# Patient Record
Sex: Female | Born: 1945 | Race: White | Hispanic: No | Marital: Married | Smoking: Never smoker
Health system: Southern US, Community
[De-identification: ages and names within clinical notes are randomized; demographics above are authoritative.]

## PROBLEM LIST (undated history)

## (undated) DIAGNOSIS — M1990 Unspecified osteoarthritis, unspecified site: Secondary | ICD-10-CM

## (undated) DIAGNOSIS — H269 Unspecified cataract: Secondary | ICD-10-CM

## (undated) DIAGNOSIS — N189 Chronic kidney disease, unspecified: Secondary | ICD-10-CM

## (undated) DIAGNOSIS — M810 Age-related osteoporosis without current pathological fracture: Secondary | ICD-10-CM

## (undated) DIAGNOSIS — R110 Nausea: Secondary | ICD-10-CM

## (undated) DIAGNOSIS — C50919 Malignant neoplasm of unspecified site of unspecified female breast: Secondary | ICD-10-CM

## (undated) DIAGNOSIS — E785 Hyperlipidemia, unspecified: Secondary | ICD-10-CM

## (undated) DIAGNOSIS — I10 Essential (primary) hypertension: Secondary | ICD-10-CM

## (undated) DIAGNOSIS — K219 Gastro-esophageal reflux disease without esophagitis: Secondary | ICD-10-CM

## (undated) DIAGNOSIS — K589 Irritable bowel syndrome without diarrhea: Secondary | ICD-10-CM

## (undated) DIAGNOSIS — T7840XA Allergy, unspecified, initial encounter: Secondary | ICD-10-CM

## (undated) DIAGNOSIS — F419 Anxiety disorder, unspecified: Secondary | ICD-10-CM

## (undated) DIAGNOSIS — G4700 Insomnia, unspecified: Secondary | ICD-10-CM

## (undated) HISTORY — DX: Essential (primary) hypertension: I10

## (undated) HISTORY — PX: POLYPECTOMY: SHX149

## (undated) HISTORY — PX: CHOLECYSTECTOMY: SHX55

## (undated) HISTORY — PX: TOTAL ABDOMINAL HYSTERECTOMY: SHX209

## (undated) HISTORY — DX: Chronic kidney disease, unspecified: N18.9

## (undated) HISTORY — PX: COSMETIC SURGERY: SHX468

## (undated) HISTORY — PX: TONSILLECTOMY: SUR1361

## (undated) HISTORY — DX: Allergy, unspecified, initial encounter: T78.40XA

## (undated) HISTORY — DX: Nausea: R11.0

## (undated) HISTORY — DX: Anxiety disorder, unspecified: F41.9

## (undated) HISTORY — DX: Unspecified cataract: H26.9

## (undated) HISTORY — PX: COLONOSCOPY: SHX174

## (undated) HISTORY — DX: Gastro-esophageal reflux disease without esophagitis: K21.9

## (undated) HISTORY — DX: Unspecified osteoarthritis, unspecified site: M19.90

## (undated) HISTORY — DX: Insomnia, unspecified: G47.00

## (undated) HISTORY — PX: MASTECTOMY: SHX3

## (undated) HISTORY — DX: Irritable bowel syndrome, unspecified: K58.9

## (undated) HISTORY — DX: Hyperlipidemia, unspecified: E78.5

## (undated) HISTORY — DX: Malignant neoplasm of unspecified site of unspecified female breast: C50.919

## (undated) HISTORY — DX: Age-related osteoporosis without current pathological fracture: M81.0

---

## 2005-06-04 ENCOUNTER — Ambulatory Visit (HOSPITAL_COMMUNITY): Admission: RE | Admit: 2005-06-04 | Discharge: 2005-06-04 | Payer: Self-pay | Admitting: Chiropractic Medicine

## 2005-08-15 ENCOUNTER — Ambulatory Visit: Payer: Self-pay | Admitting: Internal Medicine

## 2005-09-24 ENCOUNTER — Encounter (INDEPENDENT_AMBULATORY_CARE_PROVIDER_SITE_OTHER): Payer: Self-pay | Admitting: *Deleted

## 2005-09-24 ENCOUNTER — Ambulatory Visit: Payer: Self-pay | Admitting: Internal Medicine

## 2008-10-26 ENCOUNTER — Ambulatory Visit: Payer: Self-pay | Admitting: Internal Medicine

## 2008-11-10 ENCOUNTER — Ambulatory Visit: Payer: Self-pay | Admitting: Internal Medicine

## 2011-10-20 ENCOUNTER — Encounter: Payer: Self-pay | Admitting: Internal Medicine

## 2012-01-04 ENCOUNTER — Encounter: Payer: Self-pay | Admitting: Internal Medicine

## 2012-01-17 ENCOUNTER — Ambulatory Visit: Payer: Self-pay | Admitting: Obstetrics and Gynecology

## 2012-03-06 ENCOUNTER — Encounter: Payer: Self-pay | Admitting: Internal Medicine

## 2012-06-02 ENCOUNTER — Encounter: Payer: Self-pay | Admitting: Internal Medicine

## 2012-07-01 ENCOUNTER — Ambulatory Visit (AMBULATORY_SURGERY_CENTER): Payer: BC Managed Care – PPO

## 2012-07-01 VITALS — Ht 66.0 in | Wt 151.9 lb

## 2012-07-01 DIAGNOSIS — Z1211 Encounter for screening for malignant neoplasm of colon: Secondary | ICD-10-CM

## 2012-07-01 DIAGNOSIS — Z86010 Personal history of colonic polyps: Secondary | ICD-10-CM

## 2012-07-01 DIAGNOSIS — Z800 Family history of malignant neoplasm of digestive organs: Secondary | ICD-10-CM

## 2012-07-01 MED ORDER — MOVIPREP 100 G PO SOLR: ORAL | Status: DC

## 2012-07-02 ENCOUNTER — Encounter: Payer: Self-pay | Admitting: Internal Medicine

## 2012-07-15 ENCOUNTER — Ambulatory Visit (AMBULATORY_SURGERY_CENTER): Payer: Medicare Other | Admitting: Internal Medicine

## 2012-07-15 ENCOUNTER — Encounter: Payer: Self-pay | Admitting: Internal Medicine

## 2012-07-15 VITALS — BP 141/50 | HR 65 | Temp 96.8°F | Resp 18 | Ht 66.0 in | Wt 151.0 lb

## 2012-07-15 DIAGNOSIS — Z86010 Personal history of colonic polyps: Secondary | ICD-10-CM

## 2012-07-15 DIAGNOSIS — Z800 Family history of malignant neoplasm of digestive organs: Secondary | ICD-10-CM

## 2012-07-15 DIAGNOSIS — D126 Benign neoplasm of colon, unspecified: Secondary | ICD-10-CM

## 2012-07-15 DIAGNOSIS — Z1211 Encounter for screening for malignant neoplasm of colon: Secondary | ICD-10-CM

## 2012-07-15 MED ORDER — SODIUM CHLORIDE 0.9 % IV SOLN: 500.0000 mL | INTRAVENOUS | Status: DC

## 2012-07-15 NOTE — Progress Notes (Addendum)
Patient did not have preoperative order for IV antibiotic SSI prophylaxis. (G8918)  Patient did not experience any of the following events: a burn prior to discharge; a fall within the facility; wrong site/side/patient/procedure/implant event; or a hospital transfer or hospital admission upon discharge from the facility. (G8907)  

## 2012-07-15 NOTE — Op Note (Signed)
[facility] [address] [place], [address]   COLONOSCOPY PROCEDURE REPORT  PATIENT: Brittany Fuentes, Brittany Fuentes  MR#: [id number] BIRTHDATE: January 18, 1946 , 65  yrs. old GENDER: Female ENDOSCOPIST: Roxy Cedar, MD REFERRED [id number] Program Recall PROCEDURE DATE:  07/15/2012 PROCEDURE:   Colonoscopy with snare polypectomy    x 1 ASA CLASS:   Class II INDICATIONS:patient's personal history of adenomatous colon polyps (index 2006, f/u 2009) and patient's immediate family history of colon cancer (mom 67). MEDICATIONS: MAC sedation, administered by CRNA, Demerol, and propofol (Diprivan) 170mg  IV  DESCRIPTION OF PROCEDURE:   After the risks benefits and alternatives of the procedure were thoroughly explained, informed consent was obtained.  A digital rectal exam revealed no abnormalities of the rectum.   The LB CF-[id number]  endoscope was introduced through the anus and advanced to the cecum, which was identified by both the appendix and ileocecal valve. No adverse events experienced.   The quality of the prep was good, using MoviPrep  The instrument was then slowly withdrawn as the colon was fully examined.      COLON FINDINGS: A sessile polyp measuring 7 mm in size was found at the cecum.  A polypectomy was performed with a cold snare.  The resection was complete and the polyp tissue was completely retrieved.   The colon was otherwise normal.  There was no diverticulosis, inflamation, polyps or cancers unless previously stated.  Retroflexed views revealed internal hemorrhoids. The time to cecum=5 minutes 12 seconds.  Withdrawal time=11 minutes 57 seconds.  The scope was withdrawn and the procedure completed. COMPLICATIONS: There were no complications.  ENDOSCOPIC IMPRESSION: 1.   Sessile polyp measuring 7 mm in size was found at the cecum; polypectomy was performed with a cold snare 2.   The colon was otherwise normal  RECOMMENDATIONS: 1. Follow up  colonoscopy in 5 years   eSigned:  Roxy Cedar, MD 07/15/2012 9:32 AM   cc: Elijio Miles MD and The Patient

## 2012-07-15 NOTE — Patient Instructions (Addendum)
YOU HAD AN ENDOSCOPIC PROCEDURE TODAY AT THE [facility]: Refer to the procedure report that was given to you for any specific questions about what was found during the examination.  If the procedure report does not answer your questions, please call your gastroenterologist to clarify.  If you requested that your care partner not be given the details of your procedure findings, then the procedure report has been included in a sealed envelope for you to review at your convenience later.  YOU SHOULD EXPECT: Some feelings of bloating in the abdomen. Passage of more gas than usual.  Walking can help get rid of the air that was put into your GI tract during the procedure and reduce the bloating. If you had a lower endoscopy (such as a colonoscopy or flexible sigmoidoscopy) you may notice spotting of blood in your stool or on the toilet paper. If you underwent a bowel prep for your procedure, then you may not have a normal bowel movement for a few days.  DIET: Your first meal following the procedure should be a light meal and then it is ok to progress to your normal diet.  A half-sandwich or bowl of soup is an example of a good first meal.  Heavy or fried foods are harder to digest and may make you feel nauseous or bloated.  Likewise meals heavy in dairy and vegetables can cause extra gas to form and this can also increase the bloating.  Drink plenty of fluids but you should avoid alcoholic beverages for 24 hours.  ACTIVITY: Your care partner should take you home directly after the procedure.  You should plan to take it easy, moving slowly for the rest of the day.  You can resume normal activity the day after the procedure however you should NOT DRIVE or use heavy machinery for 24 hours (because of the sedation medicines used during the test).    SYMPTOMS TO REPORT IMMEDIATELY: A gastroenterologist can be reached at any hour.  During normal business hours, 8:30 AM to 5:00 PM Monday through Friday,  call [phone].  After hours and on weekends, please call the GI answering service at [phone] who will take a message and have the physician on call contact you.   Following lower endoscopy (colonoscopy or flexible sigmoidoscopy):  Excessive amounts of blood in the stool  Significant tenderness or worsening of abdominal pains  Swelling of the abdomen that is new, acute  Fever of 100F or higher  FOLLOW UP: If any biopsies were taken you will be contacted by phone or by letter within the next 1-3 weeks.  Call your gastroenterologist if you have not heard about the biopsies in 3 weeks.  Our staff will call the home number listed on your records the next business day following your procedure to check on you and address any questions or concerns that you may have at that time regarding the information given to you following your procedure. This is a courtesy call and so if there is no answer at the home number and we have not heard from you through the emergency physician on call, we will assume that you have returned to your regular daily activities without incident.  SIGNATURES/CONFIDENTIALITY: You and/or your care partner have signed paperwork which will be entered into your electronic medical record.  These signatures attest to the fact that that the information above on your After Visit Summary has been reviewed and is understood.  Full responsibility of the confidentiality of this   discharge information lies with you and/or your care-partner.   Thank-you for choosing us for your healthcare needs. 

## 2012-07-16 ENCOUNTER — Telehealth: Payer: Self-pay | Admitting: *Deleted

## 2012-07-16 NOTE — Telephone Encounter (Signed)
  Follow up Call-  Call back number 07/15/2012  Post procedure Call Back phone  # [phone]  Permission to leave phone message Yes     Patient questions:  Do you have a fever, pain , or abdominal swelling? no Pain Score  0 *  Have you tolerated food without any problems? yes  Have you been able to return to your normal activities? yes  Do you have any questions about your discharge instructions: Diet   no Medications  no Follow up visit  no  Do you have questions or concerns about your Care? no  Actions: * If pain score is 4 or above: No action needed, pain <4. Spoke with husband who states pt is doing great. No problems. ewm

## 2012-07-21 ENCOUNTER — Encounter: Payer: Self-pay | Admitting: Internal Medicine

## 2014-08-20 ENCOUNTER — Encounter: Payer: Self-pay | Admitting: Internal Medicine

## 2017-06-10 ENCOUNTER — Other Ambulatory Visit: Payer: Self-pay | Admitting: Physical Medicine and Rehabilitation

## 2017-06-10 DIAGNOSIS — M545 Low back pain: Principal | ICD-10-CM

## 2017-06-10 DIAGNOSIS — IMO0002 Reserved for concepts with insufficient information to code with codable children: Secondary | ICD-10-CM

## 2017-06-10 DIAGNOSIS — G8929 Other chronic pain: Secondary | ICD-10-CM

## 2017-06-27 ENCOUNTER — Ambulatory Visit
Admission: RE | Admit: 2017-06-27 | Discharge: 2017-06-27 | Disposition: A | Discharge status: Home / Self Care | Payer: Medicare Other | Source: Ambulatory Visit | Attending: Physical Medicine and Rehabilitation | Admitting: Physical Medicine and Rehabilitation

## 2017-06-27 DIAGNOSIS — G8929 Other chronic pain: Secondary | ICD-10-CM

## 2017-06-27 DIAGNOSIS — IMO0002 Reserved for concepts with insufficient information to code with codable children: Secondary | ICD-10-CM

## 2017-06-27 DIAGNOSIS — M545 Low back pain: Principal | ICD-10-CM

## 2017-08-14 ENCOUNTER — Encounter: Payer: Self-pay | Admitting: Internal Medicine

## 2017-10-01 ENCOUNTER — Ambulatory Visit (AMBULATORY_SURGERY_CENTER): Payer: Self-pay | Admitting: *Deleted

## 2017-10-01 ENCOUNTER — Other Ambulatory Visit: Payer: Self-pay

## 2017-10-01 VITALS — Ht 65.5 in | Wt 140.0 lb

## 2017-10-01 DIAGNOSIS — Z86010 Personal history of colonic polyps: Secondary | ICD-10-CM

## 2017-10-01 DIAGNOSIS — Z800 Family history of malignant neoplasm of digestive organs: Secondary | ICD-10-CM

## 2017-10-01 MED ORDER — NA SULFATE-K SULFATE-MG SULF 17.5-3.13-1.6 GM/177ML PO SOLN: 1.0000 | Freq: Once | ORAL | 0 refills | Status: AC

## 2017-10-01 NOTE — Progress Notes (Signed)
No egg or soy allergy known to patient  No issues with past sedation with any surgeries  or procedures, no intubation problems  No diet pills per patient No home 02 use per patient  No blood thinners per patient  Pt states  issues with constipation / diarrhea- has IBS - has used miralax and stool softeners in the past- uses prunes which helps  No A fib or A flutter  EMMI video sent to pt's e mail

## 2017-10-15 ENCOUNTER — Encounter: Payer: Self-pay | Admitting: Internal Medicine

## 2017-10-15 ENCOUNTER — Other Ambulatory Visit: Payer: Self-pay

## 2017-10-15 ENCOUNTER — Ambulatory Visit (AMBULATORY_SURGERY_CENTER): Payer: Medicare Other | Admitting: Internal Medicine

## 2017-10-15 VITALS — BP 156/68 | HR 66 | Temp 97.3°F | Resp 8 | Ht 65.5 in | Wt 140.0 lb

## 2017-10-15 DIAGNOSIS — D122 Benign neoplasm of ascending colon: Secondary | ICD-10-CM

## 2017-10-15 DIAGNOSIS — Z86010 Personal history of colonic polyps: Secondary | ICD-10-CM | POA: Diagnosis not present

## 2017-10-15 DIAGNOSIS — D120 Benign neoplasm of cecum: Secondary | ICD-10-CM | POA: Diagnosis not present

## 2017-10-15 DIAGNOSIS — Z800 Family history of malignant neoplasm of digestive organs: Secondary | ICD-10-CM

## 2017-10-15 MED ORDER — SODIUM CHLORIDE 0.9 % IV SOLN: 500.0000 mL | Freq: Once | INTRAVENOUS | Status: DC

## 2017-10-15 NOTE — Progress Notes (Signed)
Called to room to assist during endoscopic procedure.  Patient ID and intended procedure confirmed with present staff. Received instructions for my participation in the procedure from the performing physician.  

## 2017-10-15 NOTE — Patient Instructions (Signed)
YOU HAD AN ENDOSCOPIC PROCEDURE TODAY AT THE [facility]:   Refer to the procedure report that was given to you for any specific questions about what was found during the examination.  If the procedure report does not answer your questions, please call your gastroenterologist to clarify.  If you requested that your care partner not be given the details of your procedure findings, then the procedure report has been included in a sealed envelope for you to review at your convenience later.  YOU SHOULD EXPECT: Some feelings of bloating in the abdomen. Passage of more gas than usual.  Walking can help get rid of the air that was put into your GI tract during the procedure and reduce the bloating. If you had a lower endoscopy (such as a colonoscopy or flexible sigmoidoscopy) you may notice spotting of blood in your stool or on the toilet paper. If you underwent a bowel prep for your procedure, you may not have a normal bowel movement for a few days.  Please Note:  You might notice some irritation and congestion in your nose or some drainage.  This is from the oxygen used during your procedure.  There is no need for concern and it should clear up in a day or so.  SYMPTOMS TO REPORT IMMEDIATELY:   Following lower endoscopy (colonoscopy or flexible sigmoidoscopy):  Excessive amounts of blood in the stool  Significant tenderness or worsening of abdominal pains  Swelling of the abdomen that is new, acute  Fever of 100F or higher  For urgent or emergent issues, a gastroenterologist can be reached at any hour by calling [phone].   DIET:  We do recommend a small meal at first, but then you may proceed to your regular diet.  Drink plenty of fluids but you should avoid alcoholic beverages for 24 hours.  MEDICATIONS: Continue present medications.  Please see handouts given to you by your recovery nurse.  ACTIVITY:  You should plan to take it easy for the rest of today and you should NOT  DRIVE or use heavy machinery until tomorrow (because of the sedation medicines used during the test).    FOLLOW UP: Our staff will call the number listed on your records the next business day following your procedure to check on you and address any questions or concerns that you may have regarding the information given to you following your procedure. If we do not reach you, we will leave a message.  However, if you are feeling well and you are not experiencing any problems, there is no need to return our call.  We will assume that you have returned to your regular daily activities without incident.  If any biopsies were taken you will be contacted by phone or by letter within the next 1-3 weeks.  Please call us at [phone] if you have not heard about the biopsies in 3 weeks.   Thank you for allowing us to provide for your healthcare needs today.   SIGNATURES/CONFIDENTIALITY: You and/or your care partner have signed paperwork which will be entered into your electronic medical record.  These signatures attest to the fact that that the information above on your After Visit Summary has been reviewed and is understood.  Full responsibility of the confidentiality of this discharge information lies with you and/or your care-partner. 

## 2017-10-15 NOTE — Progress Notes (Signed)
A/ox3 pleased with MAC, report to Sara RN 

## 2017-10-15 NOTE — Progress Notes (Signed)
Pt's states no medical or surgical changes since previsit or office visit. 

## 2017-10-15 NOTE — Op Note (Signed)
[facility] Patient Name: Brittany Fuentes Procedure Date: 10/15/2017 11:12 AM MRN: [id number] Endoscopist: Docia Chuck. Henrene Pastor , MD Age: 71 Referring MD:  Date of Birth: 1946-10-17 Gender: Female Account #: [id number] Procedure:                Colonoscopy, with cold snare polypectomy x 2 Indications:              High risk colon cancer surveillance: Personal                            history of non-advanced adenoma. Previous                            examinations 2006, 2009, 2013. Mother with colon                            cancer late 86s Medicines:                Monitored Anesthesia Care Procedure:                Pre-Anesthesia Assessment:                           - Prior to the procedure, a History and Physical                            was performed, and patient medications and                            allergies were reviewed. The patient's tolerance of                            previous anesthesia was also reviewed. The risks                            and benefits of the procedure and the sedation                            options and risks were discussed with the patient.                            All questions were answered, and informed consent                            was obtained. Prior Anticoagulants: The patient has                            taken no previous anticoagulant or antiplatelet                            agents. ASA Grade Assessment: II - A patient with                            mild systemic disease. After reviewing the risks  and benefits, the patient was deemed in                            satisfactory condition to undergo the procedure.                           After obtaining informed consent, the colonoscope                            was passed under direct vision. Throughout the                            procedure, the patient's blood pressure, pulse, and                            oxygen saturations were  monitored continuously. The                            Colonoscope was introduced through the anus and                            advanced to the the cecum, identified by                            appendiceal orifice and ileocecal valve. The                            ileocecal valve, appendiceal orifice, and rectum                            were photographed. The quality of the bowel                            preparation was good. The colonoscopy was performed                            without difficulty. The patient tolerated the                            procedure well. The bowel preparation used was                            SUPREP. Scope In: 11:27:00 AM Scope Out: 11:51:37 AM Scope Withdrawal Time: 0 hours 19 minutes 8 seconds  Total Procedure Duration: 0 hours 24 minutes 37 seconds  Findings:                 Two polyps were found in the ascending colon and                            cecum. The polyps were 2 to 4 mm in size. These                            polyps were removed with a cold snare. Resection  and retrieval were complete.                           The exam was otherwise without abnormality on                            direct and retroflexion views.                           External hemorrhoids were found during retroflexion. Complications:            No immediate complications. Estimated blood loss:                            None. Estimated Blood Loss:     Estimated blood loss: none. Impression:               - Two 2 to 4 mm polyps in the ascending colon and                            in the cecum, removed with a cold snare. Resected                            and retrieved.                           - The examination was otherwise normal on direct                            and retroflexion views.                           - External hemorrhoids. Recommendation:           - Repeat colonoscopy in 5 years for surveillance.                            - Patient has a contact number available for                            emergencies. The signs and symptoms of potential                            delayed complications were discussed with the                            patient. Return to normal activities tomorrow.                            Written discharge instructions were provided to the                            patient.                           - Resume previous diet.                           -  Continue present medications.                           - Await pathology results. Docia Chuck. Henrene Pastor, MD 10/15/2017 11:57:13 AM This report has been signed electronically.

## 2017-10-16 ENCOUNTER — Other Ambulatory Visit: Payer: Self-pay

## 2017-10-16 ENCOUNTER — Telehealth: Payer: Self-pay | Admitting: Internal Medicine

## 2017-10-16 ENCOUNTER — Telehealth: Payer: Self-pay | Admitting: *Deleted

## 2017-10-16 MED ORDER — PROMETHAZINE HCL 12.5 MG PO TABS: 12.5000 mg | ORAL_TABLET | Freq: Four times a day (QID) | ORAL | 0 refills | Status: AC | PRN

## 2017-10-16 NOTE — Telephone Encounter (Signed)
  Follow up Call-  Call back number 10/15/2017  Post procedure Call Back phone  # [phone]  Permission to leave phone message Yes  Some recent data might be hidden   Spoke with husband, husband states pt had some nausea last evening.  Will call back to office if nausea persists this a.m.  Patient questions:  Do you have a fever, pain , or abdominal swelling? No. Pain Score  0 *  Have you tolerated food without any problems? Yes- ate but see above note  Have you been able to return to your normal activities? Yes.    Do you have any questions about your discharge instructions: Diet   No. Medications  No. Follow up visit  No.  Do you have questions or concerns about your Care? No.  Actions: * If pain score is 4 or above: No action needed, pain <4.

## 2017-10-16 NOTE — Telephone Encounter (Signed)
Brittany Fuentes pt, had colon done yesterday and is having issues with nausea. Requesting phenergan be called in for nausea. Pt has had this in the past. Dr. Hilarie Fredrickson as DOD please advise.

## 2017-10-16 NOTE — Telephone Encounter (Signed)
Etiology of the nausea, is more the question Ensure she is not having abd pain, fevers, chills.   Colonoscopy reviewed and only 2 small polyps removed. Ok for promethazine 12.5 mg every 6h prn nausea, short duration (#10) Should be evaluated if persistent nausea

## 2017-10-16 NOTE — Telephone Encounter (Signed)
Pts husband states she is not having other issues besides the nausea. Script sent to pharmacy and pts husband aware.

## 2017-10-18 ENCOUNTER — Encounter: Payer: Self-pay | Admitting: Internal Medicine

## 2018-02-03 ENCOUNTER — Telehealth: Payer: Self-pay | Admitting: Internal Medicine

## 2018-02-03 NOTE — Telephone Encounter (Signed)
Left message for pt to call back.  Spoke with pt and she is aware. States she will try escalating the miralax and call back to schedule and appt.

## 2018-02-03 NOTE — Telephone Encounter (Signed)
She has not been seen in the office in many years. Only colonoscopies. For Constipation she could take escalating doses of MiraLAX or a bottle of magnesium citrate if her kidney function is normal. If she thinks she has IBS, have her see an advanced practitioner for a more formal evaluation. Thanks

## 2018-02-03 NOTE — Telephone Encounter (Signed)
Pt returned your call.  

## 2018-02-03 NOTE — Telephone Encounter (Signed)
Left message for pt to call back.  Pt states she had a virus last week and took Imodium Saturday and one on Sunday. Pt states that now she has not had a BM since March 17. Reports she is taking one dose of miralax daily. States she had heard there was an antibiotic for IBS-C and wanted to know if [name] knew of this and could prescribe it. Discussed with her that there is an antibiotic for IBS-D. Pt wanting to know what [name] would recommend. Please advise.

## 2018-09-15 ENCOUNTER — Ambulatory Visit (INDEPENDENT_AMBULATORY_CARE_PROVIDER_SITE_OTHER): Payer: Medicare Other | Admitting: Internal Medicine

## 2018-09-15 ENCOUNTER — Encounter: Payer: Self-pay | Admitting: Internal Medicine

## 2018-09-15 VITALS — BP 130/84 | HR 84 | Ht 64.75 in | Wt 140.4 lb

## 2018-09-15 DIAGNOSIS — K5900 Constipation, unspecified: Secondary | ICD-10-CM

## 2018-09-15 DIAGNOSIS — R109 Unspecified abdominal pain: Secondary | ICD-10-CM

## 2018-09-15 DIAGNOSIS — Z860100 Personal history of colon polyps, unspecified: Secondary | ICD-10-CM

## 2018-09-15 DIAGNOSIS — R140 Abdominal distension (gaseous): Secondary | ICD-10-CM | POA: Diagnosis not present

## 2018-09-15 DIAGNOSIS — K219 Gastro-esophageal reflux disease without esophagitis: Secondary | ICD-10-CM

## 2018-09-15 NOTE — Patient Instructions (Signed)
Increase your Miralax as needed  You may take Dulcolax as needed

## 2018-09-15 NOTE — Progress Notes (Signed)
HISTORY OF PRESENT ILLNESS:  Brittany Fuentes is a 72 y.o. female with family history of colon cancer and a personal history of adenomatous colon polyps who presents today regarding management of chronic constipation and associated abdominal bloating discomfort.  She was encouraged by her son, an orthopedic surgeon in [place], to seek evaluation.  Patient has undergone previous colonoscopy in 2006, 2009, 2013, and most recently December 2018.  At the time of her last examination she was found to have 2 diminutive adenomatous polyps which were removed and external hemorrhoids.  Follow-up in 5 years recommended.  Patient reports today that she has had lifetime issues with constipation.  Currently taking 1 dose of MiraLAX each evening which results in approximately 2 bowel movements per week.  She may go longer without bowel movements.  Associated with constipation is abdominal bloating discomfort.  On rare occasion she has used Dulcolax which helps.  She was prescribed Linzess (dose unknown) which resulted in abdominal pain that was not tolerable.  She wonders about other therapies or suggestions to help manage her constipation.  No issues with incontinence.  She does have rare minor rectal bleeding attributed to her hemorrhoids.  As well, occasional reflux symptoms with dietary indiscretion.  Reflux is well managed however with Nexium therapy.  No dysphasia.  REVIEW OF SYSTEMS:  All non-GI ROS negative unless otherwise stated in the HPI except for arthritis, back pain, anxiety, sinus and allergy  Past Medical History:  Diagnosis Date  . Allergy   . Anxiety   . Arthritis    lower back, hand joints   . 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 Cataract    growing cataracts   . Chronic kidney disease    kidney stones   . GERD (gastroesophageal reflux disease)   . Hyperlipidemia   . Hypertension    pt denies   . IBS (irritable bowel syndrome)   . Insomnia   .  Nausea alone    chronic    Past Surgical History:  Procedure Laterality Date  . CHOLECYSTECTOMY    . COLONOSCOPY    . MASTECTOMY     Bil /2012  . POLYPECTOMY    . TONSILLECTOMY    . TOTAL ABDOMINAL HYSTERECTOMY      Social History Brittany Fuentes  reports that she has never smoked. She has never used smokeless tobacco. She reports that she does not drink alcohol or use drugs.  family history includes Colon cancer in her mother; Crohn's disease in her sister; Diabetes in her maternal grandfather, maternal grandmother, and sister; Liver disease in her sister; Stomach cancer in her mother.  Allergies  Allergen Reactions  . Garlic Other (See Comments)    Extreme gastro reactions  . Meperidine Hcl     PT DENIES ALLERGY TO THIS  . Onion Other (See Comments)    Extreme gastro reactions  . Oxycodone Hcl   . Propoxyphene N-Acetaminophen Hives  . Sumatriptan     GENERIC IMITREX-PT DENIES       PHYSICAL EXAMINATION: Vital signs: BP 130/84 (BP Location: Left Arm, Patient Position: Sitting, Cuff Size: Normal)   Pulse 84   Ht 5' 4.75" (1.645 m) Comment: height measured without shoes  Wt 140 lb 6 oz (63.7 kg)   BMI 23.54 kg/m   Constitutional: generally well-appearing, no acute distress Psychiatric: alert and oriented x3, cooperative Eyes: extraocular movements intact, anicteric, conjunctiva pink Mouth: oral pharynx moist, no lesions Neck: supple no lymphadenopathy  Cardiovascular: heart regular rate and rhythm, no murmur Lungs: clear to auscultation bilaterally Abdomen: soft, nontender, nondistended, no obvious ascites, no peritoneal signs, normal bowel sounds, no organomegaly Rectal: Omitted Extremities: no clubbing, cyanosis, or lower extremity edema bilaterally Skin: no lesions on visible extremities Neuro: No focal deficits.  Cranial nerves intact  ASSESSMENT:  1.  Chronic constipation.  Slow transit. 2.  Abdominal bloating discomfort associated with constipation.   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  History of adenomatous colon polyps.  Last colonoscopy December 2018 4.  Family history of colon cancer 5.  GERD managed with Nexium   PLAN:  1.  Discussed increasing MiraLAX to achieve desired effect as this is tolerated 2.  Okay to use Dulcolax as needed as this is tolerated 3.  Could consider trial of Amitiza 4.  Reflux precautions 5.  Continue Nexium 6.  Surveillance colonoscopy around December 2023 7.  Interval GI follow-up as needed  25-minute spent face-to-face with the patient.  Greater than 50% the time used for counseling regarding her chronic constipation and its management

## 2020-09-30 ENCOUNTER — Other Ambulatory Visit: Payer: Self-pay | Admitting: Orthopedic Surgery

## 2020-09-30 DIAGNOSIS — M25562 Pain in left knee: Secondary | ICD-10-CM

## 2020-10-22 ENCOUNTER — Other Ambulatory Visit: Payer: Self-pay

## 2020-10-22 ENCOUNTER — Ambulatory Visit
Admission: RE | Admit: 2020-10-22 | Discharge: 2020-10-22 | Disposition: A | Discharge status: Home / Self Care | Payer: Medicare Other | Source: Ambulatory Visit | Attending: Orthopedic Surgery | Admitting: Orthopedic Surgery

## 2020-10-22 DIAGNOSIS — M25562 Pain in left knee: Secondary | ICD-10-CM

## 2021-07-19 ENCOUNTER — Ambulatory Visit: Payer: Medicare Other | Admitting: Internal Medicine

## 2022-02-21 IMAGING — MR MR KNEE*L* W/O CM
4 of 6 series · 23 of 40 positions shown · non-contrast
Comparison: None.

CLINICAL DATA: Left knee pain along the medial aspect with swelling
for 3 weeks

EXAM:
MRI OF THE LEFT KNEE WITHOUT CONTRAST
TECHNIQUE: Multiplanar, multisequence MR imaging of the knee was performed. No
intravenous contrast was administered.

[Series 4: T2 fat-sat · coronal · 4.0mm · 0.59mm/px · 6 of 25 slices shown (1 of 2)]
[im 1/25]
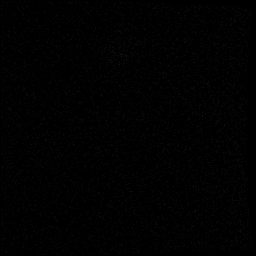
[im 5/25]
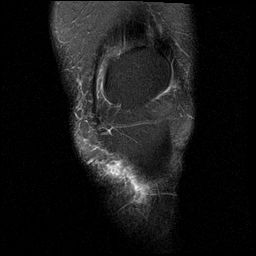
[im 10/25]
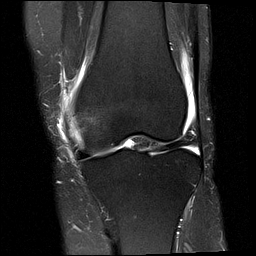
[im 15/25]
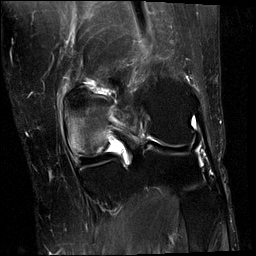
[im 20/25]
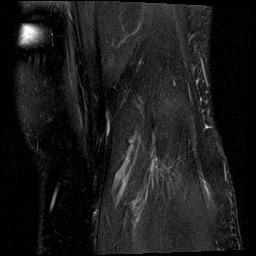
[im 25/25]
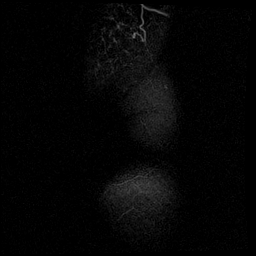

[Series 5: T1 · coronal · 4.0mm · 0.29mm/px · 3 of 25 slices shown]
[im 5/25]
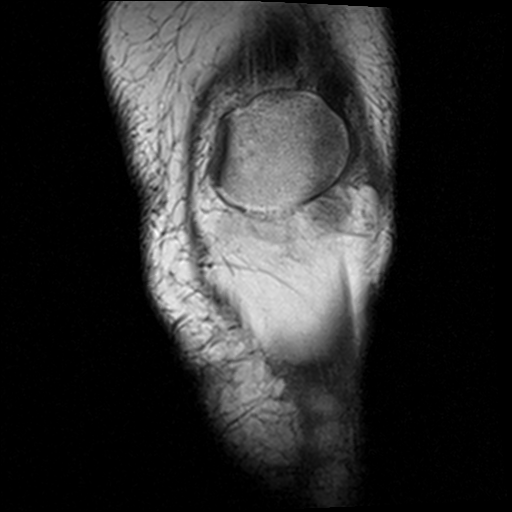
[im 15/25]
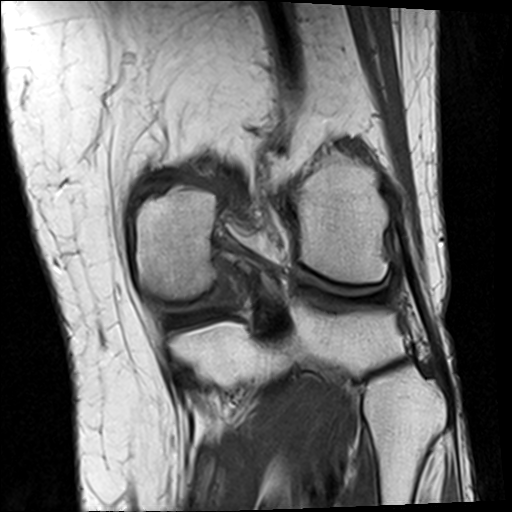
[im 25/25]
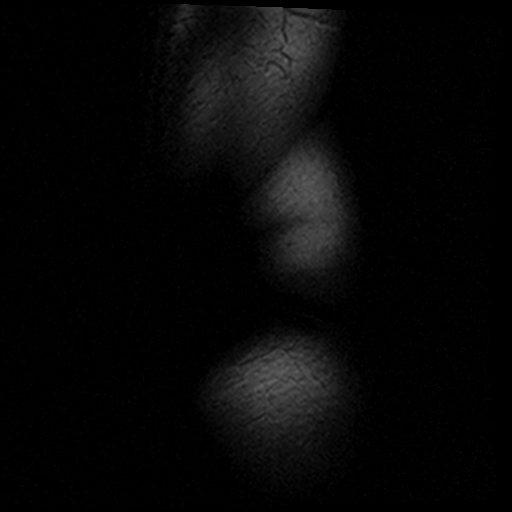

[Series 7: PD fat-sat · sagittal · 3.0mm · 0.29mm/px · 7 of 32 slices shown]
[im 1/32]
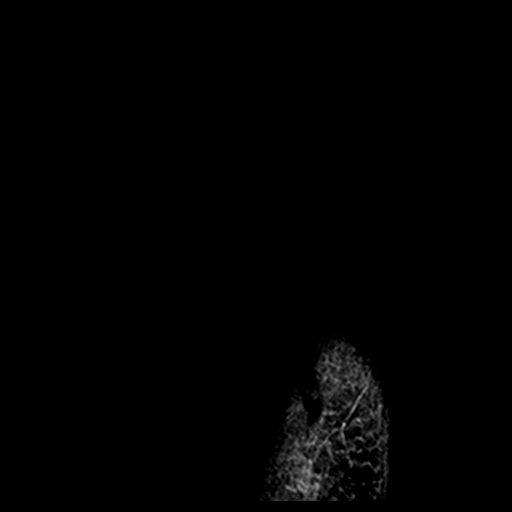
[im 6/32]
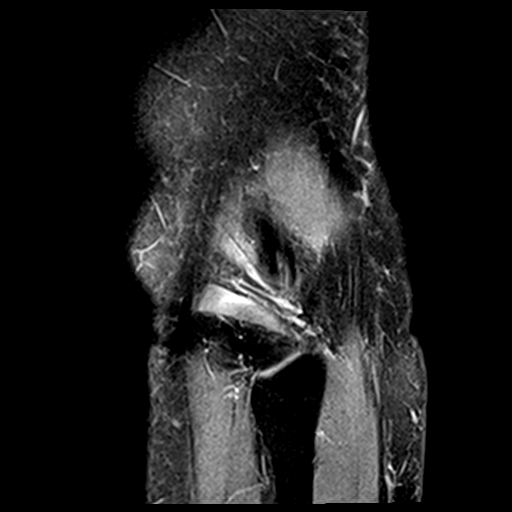
[im 11/32]
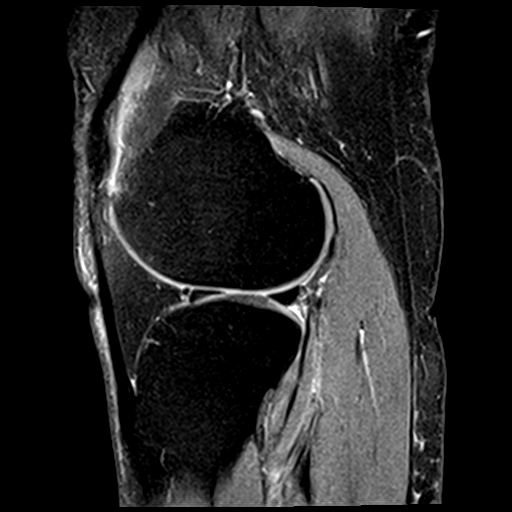
[im 16/32]
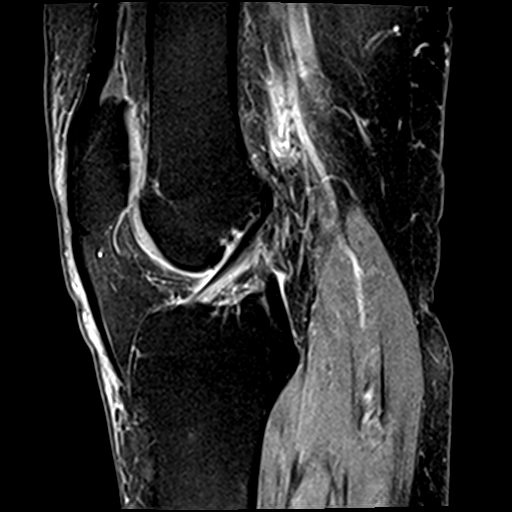
[im 21/32]
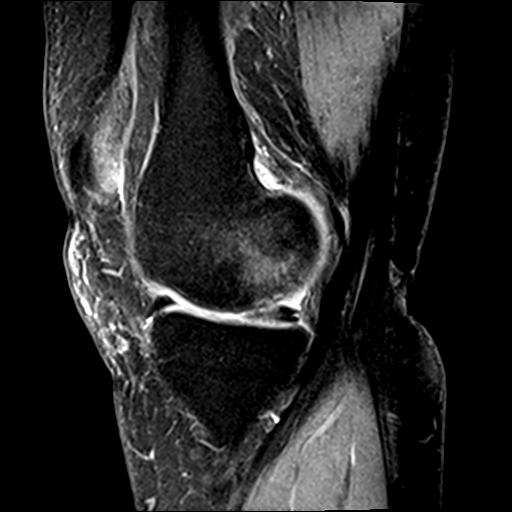
[im 26/32]
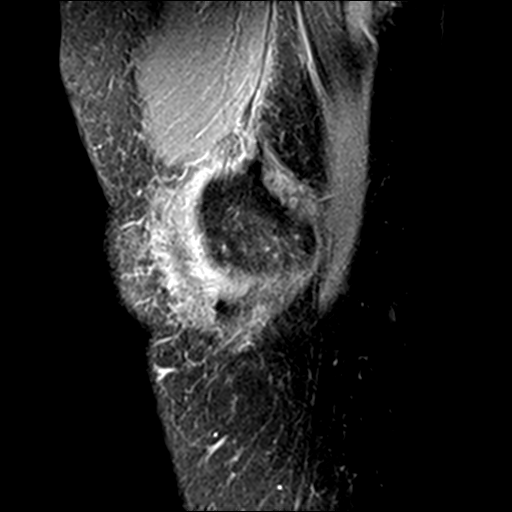
[im 32/32]
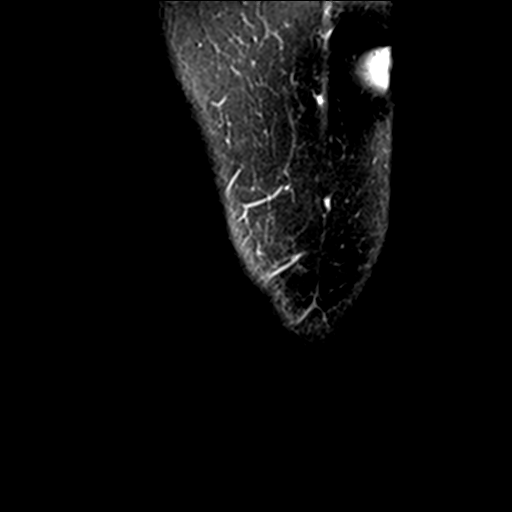

[Series 8: T2 fat-sat · sagittal · 3.0mm · 0.29mm/px · 7 of 32 slices shown (2 of 2)]
[im 1/32]
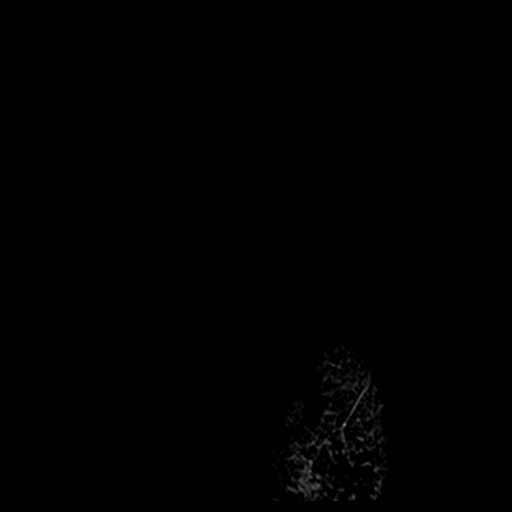
[im 6/32]
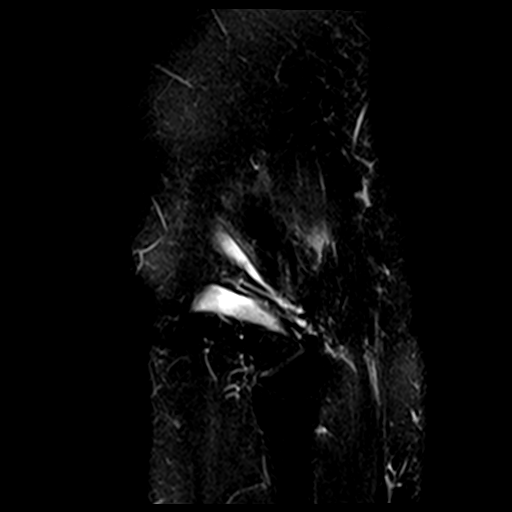
[im 11/32]
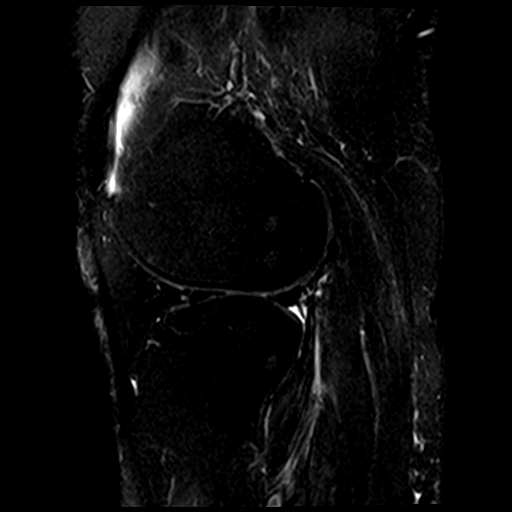
[im 16/32]
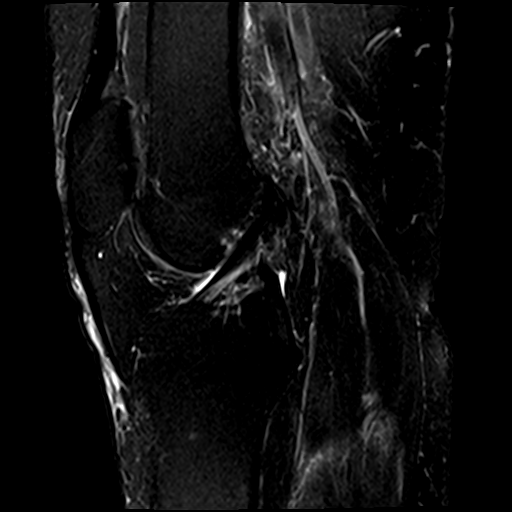
[im 21/32]
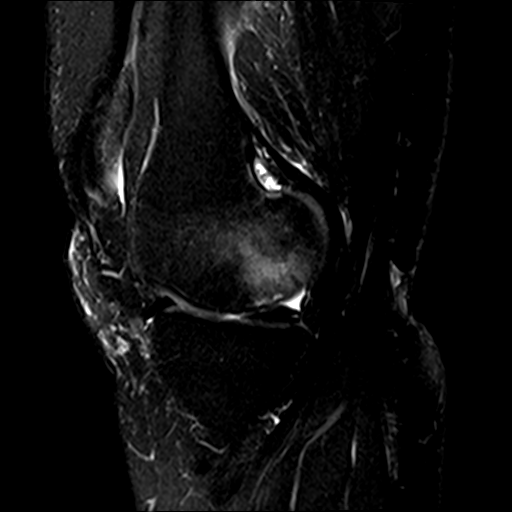
[im 26/32]
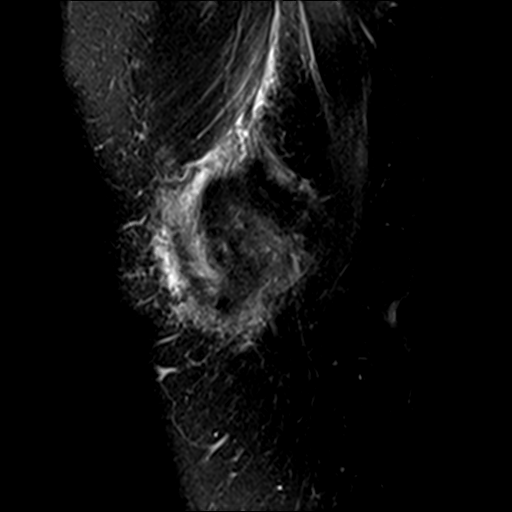
[im 32/32]
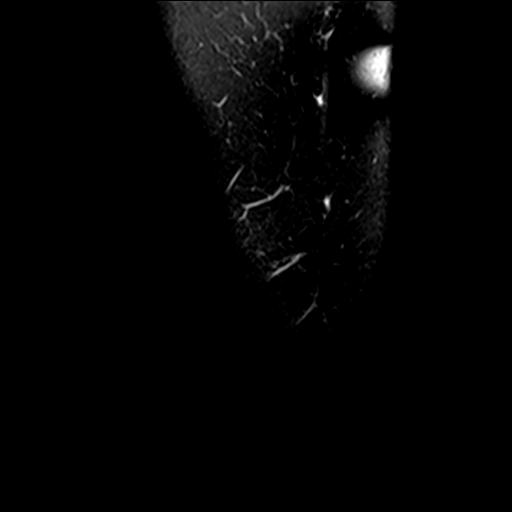

[23 of 40 positions shown; findings below may reference images not displayed]

FINDINGS: MENISCI

Medial: Degeneration of the posterior horn of the medial meniscus
with fraying along the free edge.

Lateral: Intact.

LIGAMENTS

Cruciates: ACL and PCL are intact.

Collaterals: Medial collateral ligament is intact. Small amount of
fluid superficial to the MCL likely reflecting mild bursitis.
Lateral collateral ligament complex is intact.

CARTILAGE

Patellofemoral: Partial-thickness cartilage loss of the medial
patellar facet.

Medial: Partial-thickness cartilage loss of the medial femorotibial
compartment with areas of high-grade partial-thickness cartilage
loss of the posterior weight-bearing surface of the medial femoral
condyle with severe subchondral marrow edema.

Lateral:  Mild chondral thinning of the lateral femoral condyle.

JOINT: No joint effusion. Normal Fusteria Gao. No plical
thickening.

POPLITEAL FOSSA: Popliteus tendon is intact. No Baker's cyst.

EXTENSOR MECHANISM: Intact quadriceps tendon. Intact patellar
tendon. Intact lateral patellar retinaculum. Intact medial patellar
retinaculum. Intact MPFL.

BONES: No aggressive osseous lesion. No fracture or dislocation.

Other: No fluid collection or hematoma. Muscles are normal.
IMPRESSION: 1. Tricompartmental cartilage abnormalities as described above.
2. Degeneration of the posterior horn of the medial meniscus with
fraying along the free edge.

## 2022-09-07 ENCOUNTER — Encounter: Payer: Self-pay | Admitting: Internal Medicine

## 2022-10-08 ENCOUNTER — Encounter: Payer: Self-pay | Admitting: Internal Medicine

## 2022-11-28 ENCOUNTER — Ambulatory Visit (AMBULATORY_SURGERY_CENTER): Payer: Medicare Other | Admitting: *Deleted

## 2022-11-28 VITALS — Ht 65.0 in | Wt 128.0 lb

## 2022-11-28 DIAGNOSIS — Z800 Family history of malignant neoplasm of digestive organs: Secondary | ICD-10-CM

## 2022-11-28 DIAGNOSIS — Z86010 Personal history of colonic polyps: Secondary | ICD-10-CM

## 2022-11-28 MED ORDER — NA SULFATE-K SULFATE-MG SULF 17.5-3.13-1.6 GM/177ML PO SOLN: 1.0000 | Freq: Once | ORAL | 0 refills | Status: AC

## 2022-11-28 NOTE — Progress Notes (Signed)

## 2022-12-04 ENCOUNTER — Telehealth: Payer: Self-pay | Admitting: Internal Medicine

## 2022-12-04 NOTE — Telephone Encounter (Signed)
Patient called in regards to her procedure. States that she has developed an allergy to Dairy products. She is wondering if this will hinder her procedure in any way with the prep. Please advise.

## 2022-12-04 NOTE — Telephone Encounter (Signed)
Spoke with pt and told her ok to proceed as scheduled

## 2022-12-11 ENCOUNTER — Ambulatory Visit (INDEPENDENT_AMBULATORY_CARE_PROVIDER_SITE_OTHER)
Admission: RE | Admit: 2022-12-11 | Discharge: 2022-12-11 | Disposition: A | Discharge status: Home / Self Care | Payer: Medicare Other | Source: Ambulatory Visit | Attending: Physician Assistant | Admitting: Physician Assistant

## 2022-12-11 ENCOUNTER — Encounter: Payer: Self-pay | Admitting: Physician Assistant

## 2022-12-11 ENCOUNTER — Telehealth: Payer: Self-pay | Admitting: Internal Medicine

## 2022-12-11 ENCOUNTER — Other Ambulatory Visit (INDEPENDENT_AMBULATORY_CARE_PROVIDER_SITE_OTHER): Payer: Medicare Other

## 2022-12-11 ENCOUNTER — Ambulatory Visit (INDEPENDENT_AMBULATORY_CARE_PROVIDER_SITE_OTHER): Payer: Medicare Other | Admitting: Physician Assistant

## 2022-12-11 VITALS — BP 124/78 | HR 57 | Ht 65.5 in | Wt 124.0 lb

## 2022-12-11 DIAGNOSIS — R1084 Generalized abdominal pain: Secondary | ICD-10-CM | POA: Diagnosis not present

## 2022-12-11 DIAGNOSIS — R194 Change in bowel habit: Secondary | ICD-10-CM | POA: Diagnosis not present

## 2022-12-11 DIAGNOSIS — K219 Gastro-esophageal reflux disease without esophagitis: Secondary | ICD-10-CM

## 2022-12-11 LAB — COMPREHENSIVE METABOLIC PANEL
ALT: 30 U/L (ref 0–35)
AST: 33 U/L (ref 0–37)
Albumin: 4.7 g/dL (ref 3.5–5.2)
Alkaline Phosphatase: 68 U/L (ref 39–117)
BUN: 16 mg/dL (ref 6–23)
CO2: 29 mEq/L (ref 19–32)
Calcium: 9.8 mg/dL (ref 8.4–10.5)
Chloride: 104 mEq/L (ref 96–112)
Creatinine, Ser: 0.89 mg/dL (ref 0.40–1.20)
GFR: 63.46 mL/min (ref >60.00)
Glucose, Bld: 101 mg/dL — ABNORMAL HIGH (ref 70–99)
Potassium: 4 mEq/L (ref 3.5–5.1)
Sodium: 143 mEq/L (ref 135–145)
Total Bilirubin: 0.3 mg/dL (ref 0.2–1.2)
Total Protein: 8 g/dL (ref 6.0–8.3)

## 2022-12-11 LAB — CBC WITH DIFFERENTIAL/PLATELET
Basophils Absolute: 0 10*3/uL (ref 0.0–0.1)
Basophils Relative: 0.2 % (ref 0.0–3.0)
Eosinophils Absolute: 0 10*3/uL (ref 0.0–0.7)
Eosinophils Relative: 0.3 % (ref 0.0–5.0)
HCT: 41.1 % (ref 36.0–46.0)
Hemoglobin: 13.6 g/dL (ref 12.0–15.0)
Lymphocytes Relative: 24.1 % (ref 12.0–46.0)
Lymphs Abs: 2 10*3/uL (ref 0.7–4.0)
MCHC: 33.1 g/dL (ref 30.0–36.0)
MCV: 100 fl (ref 78.0–100.0)
Monocytes Absolute: 0.6 10*3/uL (ref 0.1–1.0)
Monocytes Relative: 7.8 % (ref 3.0–12.0)
Neutro Abs: 5.6 10*3/uL (ref 1.4–7.7)
Neutrophils Relative %: 67.6 % (ref 43.0–77.0)
Platelets: 255 10*3/uL (ref 150.0–400.0)
RBC: 4.11 Mil/uL (ref 3.87–5.11)
RDW: 13.6 % (ref 11.5–15.5)
WBC: 8.2 10*3/uL (ref 4.0–10.5)

## 2022-12-11 LAB — TSH: TSH: 2.94 u[IU]/mL (ref 0.35–5.50)

## 2022-12-11 LAB — SEDIMENTATION RATE: Sed Rate: 26 mm/hr (ref 0–30)

## 2022-12-11 NOTE — Progress Notes (Signed)
Few. Yes, make sure her stool studies are negative and she is feeling better before moving forward with colonoscopy.  She can always cancel if needed.

## 2022-12-11 NOTE — Progress Notes (Signed)
12/11/2022 Brittany Fuentes. Hearty [id number] 10-Jul-1946  Referring provider: Derrill Fuentes., MD Primary GI doctor: Dr. Henrene Fuentes  ASSESSMENT AND PLAN:   Generalized abdominal pain with change in bowel habit and GERD, fever, chills Scheduled for Colonoscopy 02/07,patient would like to try to keep but will depend on labs and if patient will be able to tolerate the prep closer to the colonoscopy With acuity and chills, can be from infection, will check diatherix for GI pathogen panel with Cdiff and H pylori done here in the office and if positive we should have enough time to reschedule.  She is also tapering off klonopin and tapered off topiramate so some of the symptoms could be withdrawal, discussed with patient and will discuss with PCP Check CBC, CMET, TSH and sed rate Patient had surprising amount of soft stool with her rectal exam, will get KUB Can add on FDGard, pepcid, and continue levsin, given information about post infectious IBS Pending labs, can consider CT AB and pelvis.  Patient Care Team: Brittany Fuentes., MD as PCP - General (Family Medicine)  HISTORY OF PRESENT ILLNESS: 77 y.o. female with a past medical history of family history of colon cancer and a personal history of adenomatous colon polyps, chronic constipation, history of breast cancer s/p bilateral mastectomy, GERD on nexium and others listed below presents for evaluation of GI upset.  10/2017 Colonoscopy 2 diminutive adenomatous polyps which were removed and external hemorrhoids. Follow-up in 5 years recommended.  09/15/2018 office visit with Dr. 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 Linzess caused AB pain.   Scheduled for colonosocpy 02/07 but for 6 days has been having GI upset, feeling weak, only liquids, here for evaluation.   06/06/2022 CBC HGB 12, MCV 101 07/27/22 GFR 45, normal liver   She states since the 01/23 she has bee sick.  She had at least 5-6 Bm's, soft serve ice cream that first  day and following day, started to have AB cramping associated with it.  Start bowel rest with liquids like jello, fluids and banana. Had some left over levsin and took.  She has had chills with fever in the beginning.  She has been drinking peppermint tea but had severe gastritis yesterday, state had severe upper epigastric pain.  Denies nausea, vomiting.  She had a little bit of broth and noodles and she started again.  She had been on klonopin at night for 20 years at night, and then in April stopped klonopin but had severe withdraws, she is one month into a taper off klonopin, she is on 1.5 mg.  She had been on the topamax '100mg'$  since her mastectomy in 2012 for itching, she was transitioning down 25 mg every week for 3 weeks. She stopped it Wednesday the 01/24.  No supplements over the counter.  No ABX in last 6 month, no travel, no sick contacts.  Has had sinus issues, taking mucinex, feels fluid filled in ears.  Denies NSAIDS, ETOH.  Son is ortho PA in [place].   She  reports that she has never smoked. She has never used smokeless tobacco. She reports that she does not drink alcohol and does not use drugs.  Current Medications:   Current Outpatient Medications (Endocrine & Metabolic):    denosumab (PROLIA) 60 MG/ML SOSY injection, Inject into the skin.   Current Outpatient Medications (Cardiovascular):    amLODipine (NORVASC) 10 MG tablet, Take 10 mg by mouth daily.   pravastatin (PRAVACHOL) 20 MG tablet,  Take 20 mg by mouth daily.   Current Outpatient Medications (Respiratory):    promethazine (PHENERGAN) 12.5 MG tablet, Take 1 tablet (12.5 mg total) by mouth every 6 (six) hours as needed for nausea or vomiting.       Current Outpatient Medications (Other):    clonazePAM (KLONOPIN) 1 MG tablet, Take 1 mg by mouth. Take 2 tabs at bedtime   esomeprazole (NEXIUM) 40 MG capsule, Take 40 mg by mouth daily at 12 noon.   hyoscyamine (ANASPAZ) 0.125 MG TBDP disintergrating  tablet, Place 0.125 mg under the tongue as needed.   Probiotic Product (PROBIOTIC ADVANCED PO), Take by mouth.  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 1   Cataract    growing cataracts    Chronic kidney disease    kidney stones    GERD (gastroesophageal reflux disease)    Hyperlipidemia    Hypertension    pt denies    IBS (irritable bowel syndrome)    Insomnia    Nausea alone    chronic   Osteoporosis    Allergies:  Allergies  Allergen Reactions   Garlic Other (See Comments)    Extreme gastro reactions   Meperidine Hcl     PT DENIES ALLERGY TO THIS   Onion Other (See Comments)    Extreme gastro reactions   Oxycodone Hcl    Propoxyphene N-Acetaminophen Hives     Surgical History:  She  has a past surgical history that includes Mastectomy; Total abdominal hysterectomy; Cholecystectomy; Tonsillectomy; Colonoscopy; Polypectomy; and Cosmetic surgery. Family History:  Her family history includes Colon cancer in her mother; Crohn's disease in her sister; Diabetes in her maternal grandfather, maternal grandmother, and sister; Liver disease in her sister; Stomach cancer in her mother; Ulcerative colitis in her sister.  REVIEW OF SYSTEMS  : All other systems reviewed and negative except where noted in the History of Present Illness.  PHYSICAL EXAM: BP 124/78   Pulse (!) 57   Ht 5' 5.5" (1.664 m)   Wt 124 lb (56.2 kg)   BMI 20.32 kg/m  General:   Pleasant, well developed female in no acute distress Head:   Normocephalic and atraumatic. Eyes:  sclerae anicteric,conjunctive pink  Heart:   regular rate and rhythm Pulm:  Clear anteriorly; no wheezing Abdomen:   Soft, Flat AB, Active bowel sounds. mild tenderness in the  epigastrium and in the LLQ. Without guarding and Without rebound, No organomegaly appreciated. Rectal: hemorrhoidal skin tags, normal rectal tone, internal hemorrhoids appreciated, no masses, non tender, large volume soft brown stool Extremities:  Without edema. Msk: Symmetrical without gross deformities. Peripheral pulses intact.  Neurologic:  Alert and  oriented x4;  No focal deficits.  Skin:   Dry and intact without significant lesions or rashes. Psychiatric:  Cooperative. Normal mood and affect.  Vladimir Crofts, PA-C 2:37 PM

## 2022-12-11 NOTE — Telephone Encounter (Signed)
Inbound call from patient stating that she has been having gastro issues for the last 6 days and is scheduled to have a colonoscopy on 2/7 at 8:30 with [name] and wants to be seen before the procedure. Patient is requesting a call back to discuss. Please advise.

## 2022-12-11 NOTE — Patient Instructions (Addendum)
Your provider has requested that you go to the basement level for lab work before leaving today. Press "B" on the elevator. The lab is located at the first door on the left as you exit the elevator. Your provider has requested that you have an abdominal x ray before leaving today. Please go to the basement floor to our Radiology department for the test.   Keep your scheduled colonoscopy with Dr Henrene Pastor unless you have infection  Can take pepcid/famotidine as needed, will get FDGard samples Please take this medication 30 minutes to 1 hour before meals- this makes it more effective.  Avoid spicy and acidic foods Avoid fatty foods Limit your intake of coffee, tea, alcohol, and carbonated drinks Work to maintain a healthy weight Keep the head of the bed elevated at least 3 inches with blocks or a wedge pillow if you are having any nighttime symptoms Stay upright for 2 hours after eating Avoid meals and snacks three to four hours before bedtime   You may have POST INFECTIOUS IBS OR IRRITABLE BOWEL After an infection or diverticulitis flare your intestines can spasm or be a little bit more sensitive. Try these things below:  Can do BRAT diet versus low FODMAP- see below Try trial off milk/lactose products.  Add fiber like benefiber or citracel once a day Can do trial of IBGard for AB pain EVERY DAY- Take 1-2 capsules once a day for maintence or twice a day during a flare Can take dicyclomine as needed.  if any worsening symptoms like blood in stool, weight loss, please call the office or go to the ER.    FODMAP stands for fermentable oligo-, di-, mono-saccharides and polyols (1). These are the scientific terms used to classify groups of carbs that are notorious for triggering digestive symptoms like bloating, gas and stomach pain.     Due to recent changes in healthcare laws, you may see the results of your imaging and laboratory studies on MyChart before your provider has had a chance to  review them.  We understand that in some cases there may be results that are confusing or concerning to you. Not all laboratory results come back in the same time frame and the provider may be waiting for multiple results in order to interpret others.  Please give [place] 48 hours in order for your provider to thoroughly review all the results before contacting the office for clarification of your results.    _______________________________________________________  If your blood pressure at your visit was 140/90 or greater, please contact your primary care physician to follow up on this.  _______________________________________________________  If you are age 55 or older, your body mass index should be between 23-30. Your Body mass index is 20.32 kg/m. If this is out of the aforementioned range listed, please consider follow up with your Primary Care Provider.  If you are age 27 or younger, your body mass index should be between 19-25. Your Body mass index is 20.32 kg/m. If this is out of the aformentioned range listed, please consider follow up with your Primary Care Provider.   ________________________________________________________  The [facility] providers would like to encourage you to use [facility] to communicate with providers for non-urgent requests or questions.  Due to long hold times on the telephone, sending your provider a message by [facility] may be a faster and more efficient way to get a response.  Please allow 48 business hours for a response.  Please remember that this is for non-urgent requests.  _______________________________________________________   

## 2022-12-11 NOTE — Telephone Encounter (Signed)
Pt states she has had GI upset and has not been able to really eat for the past several days. Reports she is really weak. Has been doing liquids. Pt scheduled to see Vicie Mutters PA today at 2pm. Pt aware of appt.

## 2022-12-12 DIAGNOSIS — K219 Gastro-esophageal reflux disease without esophagitis: Secondary | ICD-10-CM

## 2022-12-12 DIAGNOSIS — R1084 Generalized abdominal pain: Secondary | ICD-10-CM

## 2022-12-14 ENCOUNTER — Telehealth: Payer: Self-pay | Admitting: Physician Assistant

## 2022-12-14 NOTE — Telephone Encounter (Signed)
Called the patient to go over results.  She will call back to update me how she is doing and if we want to proceed with the scheduled colonoscopy on 02/07 or postpone it.

## 2022-12-14 NOTE — Telephone Encounter (Signed)
Patient is returning you call, states she has her phone with her now and will be able to answer when you have the chance to call back.

## 2022-12-14 NOTE — Telephone Encounter (Signed)
Appt cancelled and rescheduled to 01/28/23 at 9am.

## 2022-12-19 ENCOUNTER — Encounter: Payer: Medicare Other | Admitting: Internal Medicine

## 2022-12-27 ENCOUNTER — Telehealth: Payer: Self-pay | Admitting: Physician Assistant

## 2022-12-27 NOTE — Telephone Encounter (Signed)
Inbound call from patient states she has been experiencing abdominal pain for the last two weeks. Please advise.  Thank you

## 2022-12-28 ENCOUNTER — Other Ambulatory Visit: Payer: Self-pay

## 2022-12-28 DIAGNOSIS — K219 Gastro-esophageal reflux disease without esophagitis: Secondary | ICD-10-CM

## 2022-12-28 DIAGNOSIS — R1084 Generalized abdominal pain: Secondary | ICD-10-CM

## 2022-12-28 MED ORDER — HYOSCYAMINE SULFATE 0.125 MG PO TBDP: 0.1250 mg | ORAL_TABLET | ORAL | 0 refills | Status: DC | PRN

## 2022-12-28 MED ORDER — ESOMEPRAZOLE MAGNESIUM 40 MG PO CPDR: 40.0000 mg | DELAYED_RELEASE_CAPSULE | Freq: Two times a day (BID) | ORAL | 3 refills | Status: DC

## 2022-12-28 NOTE — Telephone Encounter (Signed)
See my chart message

## 2022-12-28 NOTE — Telephone Encounter (Signed)
Patient with possible gastroenteritis with loose stools, AB pain. Negative GI pathogen panel. Rescheduled Colon from Feb to March.  Stools have improved but having post prandial AB pain.  Will get AB [place], CBC, CMET Increase nexium to twice a day and we can see if we can add on EGD to colonoscopy scheduled.

## 2023-01-04 ENCOUNTER — Ambulatory Visit (HOSPITAL_COMMUNITY): Payer: Medicare Other

## 2023-01-07 ENCOUNTER — Ambulatory Visit (HOSPITAL_COMMUNITY)
Admission: RE | Admit: 2023-01-07 | Discharge: 2023-01-07 | Disposition: A | Discharge status: Home / Self Care | Payer: Medicare Other | Source: Ambulatory Visit | Attending: Physician Assistant | Admitting: Physician Assistant

## 2023-01-07 ENCOUNTER — Other Ambulatory Visit (INDEPENDENT_AMBULATORY_CARE_PROVIDER_SITE_OTHER): Payer: Medicare Other

## 2023-01-07 DIAGNOSIS — K219 Gastro-esophageal reflux disease without esophagitis: Secondary | ICD-10-CM | POA: Insufficient documentation

## 2023-01-07 DIAGNOSIS — R1084 Generalized abdominal pain: Secondary | ICD-10-CM | POA: Insufficient documentation

## 2023-01-07 LAB — COMPREHENSIVE METABOLIC PANEL
ALT: 13 U/L (ref 0–35)
AST: 20 U/L (ref 0–37)
Albumin: 4 g/dL (ref 3.5–5.2)
Alkaline Phosphatase: 70 U/L (ref 39–117)
BUN: 9 mg/dL (ref 6–23)
CO2: 33 mEq/L — ABNORMAL HIGH (ref 19–32)
Calcium: 9.6 mg/dL (ref 8.4–10.5)
Chloride: 105 mEq/L (ref 96–112)
Creatinine, Ser: 1.01 mg/dL (ref 0.40–1.20)
GFR: 54.49 mL/min — ABNORMAL LOW (ref >60.00)
Glucose, Bld: 94 mg/dL (ref 70–99)
Potassium: 4.3 mEq/L (ref 3.5–5.1)
Sodium: 145 mEq/L (ref 135–145)
Total Bilirubin: 0.3 mg/dL (ref 0.2–1.2)
Total Protein: 7.4 g/dL (ref 6.0–8.3)

## 2023-01-07 LAB — CBC WITH DIFFERENTIAL/PLATELET
Basophils Absolute: 0 10*3/uL (ref 0.0–0.1)
Basophils Relative: 0.7 % (ref 0.0–3.0)
Eosinophils Absolute: 0.1 10*3/uL (ref 0.0–0.7)
Eosinophils Relative: 1.6 % (ref 0.0–5.0)
HCT: 39.5 % (ref 36.0–46.0)
Hemoglobin: 13.2 g/dL (ref 12.0–15.0)
Lymphocytes Relative: 29.3 % (ref 12.0–46.0)
Lymphs Abs: 1.4 10*3/uL (ref 0.7–4.0)
MCHC: 33.5 g/dL (ref 30.0–36.0)
MCV: 99.8 fl (ref 78.0–100.0)
Monocytes Absolute: 0.5 10*3/uL (ref 0.1–1.0)
Monocytes Relative: 11.6 % (ref 3.0–12.0)
Neutro Abs: 2.7 10*3/uL (ref 1.4–7.7)
Neutrophils Relative %: 56.8 % (ref 43.0–77.0)
Platelets: 238 10*3/uL (ref 150.0–400.0)
RBC: 3.96 Mil/uL (ref 3.87–5.11)
RDW: 13.1 % (ref 11.5–15.5)
WBC: 4.7 10*3/uL (ref 4.0–10.5)

## 2023-01-22 ENCOUNTER — Other Ambulatory Visit: Payer: Self-pay | Admitting: Physician Assistant

## 2023-01-28 ENCOUNTER — Encounter: Payer: Self-pay | Admitting: Internal Medicine

## 2023-01-28 ENCOUNTER — Ambulatory Visit (AMBULATORY_SURGERY_CENTER): Payer: Medicare Other | Admitting: Internal Medicine

## 2023-01-28 VITALS — BP 141/73 | HR 56 | Temp 98.8°F | Resp 18 | Ht 65.0 in | Wt 128.0 lb

## 2023-01-28 DIAGNOSIS — D123 Benign neoplasm of transverse colon: Secondary | ICD-10-CM

## 2023-01-28 DIAGNOSIS — D125 Benign neoplasm of sigmoid colon: Secondary | ICD-10-CM

## 2023-01-28 DIAGNOSIS — Z86010 Personal history of colonic polyps: Secondary | ICD-10-CM | POA: Diagnosis not present

## 2023-01-28 DIAGNOSIS — Z09 Encounter for follow-up examination after completed treatment for conditions other than malignant neoplasm: Secondary | ICD-10-CM | POA: Diagnosis not present

## 2023-01-28 DIAGNOSIS — D128 Benign neoplasm of rectum: Secondary | ICD-10-CM

## 2023-01-28 DIAGNOSIS — D122 Benign neoplasm of ascending colon: Secondary | ICD-10-CM | POA: Diagnosis not present

## 2023-01-28 DIAGNOSIS — Z800 Family history of malignant neoplasm of digestive organs: Secondary | ICD-10-CM

## 2023-01-28 DIAGNOSIS — K635 Polyp of colon: Secondary | ICD-10-CM

## 2023-01-28 MED ORDER — SODIUM CHLORIDE 0.9 % IV SOLN: 500.0000 mL | Freq: Once | INTRAVENOUS | Status: DC

## 2023-01-28 NOTE — Progress Notes (Signed)
Pt's states no medical or surgical changes since previsit or office visit. 

## 2023-01-28 NOTE — Patient Instructions (Addendum)
Repeat colonoscopy in 3 years for surveillance. Resume previous diet. Continue present medications. Await pathology results.  Please read over handouts about polyps, diverticulosis and hemorrhoids  YOU HAD AN ENDOSCOPIC PROCEDURE TODAY AT [facility]:   Refer to the procedure report that was given to you for any specific questions about what was found during the examination.  If the procedure report does not answer your questions, please call your gastroenterologist to clarify.  If you requested that your care partner not be given the details of your procedure findings, then the procedure report has been included in a sealed envelope for you to review at your convenience later.  YOU SHOULD EXPECT: Some feelings of bloating in the abdomen. Passage of more gas than usual.  Walking can help get rid of the air that was put into your GI tract during the procedure and reduce the bloating. If you had a lower endoscopy (such as a colonoscopy or flexible sigmoidoscopy) you may notice spotting of blood in your stool or on the toilet paper. If you underwent a bowel prep for your procedure, you may not have a normal bowel movement for a few days.  Please Note:  You might notice some irritation and congestion in your nose or some drainage.  This is from the oxygen used during your procedure.  There is no need for concern and it should clear up in a day or so.  SYMPTOMS TO REPORT IMMEDIATELY:  Following lower endoscopy (colonoscopy or flexible sigmoidoscopy):  Excessive amounts of blood in the stool  Significant tenderness or worsening of abdominal pains  Swelling of the abdomen that is new, acute  Fever of 100F or higher  For urgent or emergent issues, a gastroenterologist can be reached at any hour by calling [phone]. Do not use MyChart messaging for urgent concerns.    DIET:  We do recommend a small meal at first, but then you may proceed to your regular diet.  Drink plenty of  fluids but you should avoid alcoholic beverages for 24 hours.  ACTIVITY:  You should plan to take it easy for the rest of today and you should NOT DRIVE or use heavy machinery until tomorrow (because of the sedation medicines used during the test).    FOLLOW UP: Our staff will call the number listed on your records the next business day following your procedure.  We will call around 7:15- 8:00 am to check on you and address any questions or concerns that you may have regarding the information given to you following your procedure. If we do not reach you, we will leave a message.     If any biopsies were taken you will be contacted by phone or by letter within the next 1-3 weeks.  Please call us at [phone] if you have not heard about the biopsies in 3 weeks.    SIGNATURES/CONFIDENTIALITY: You and/or your care partner have signed paperwork which will be entered into your electronic medical record.  These signatures attest to the fact that that the information above on your After Visit Summary has been reviewed and is understood.  Full responsibility of the confidentiality of this discharge information lies with you and/or your care-partner.

## 2023-01-28 NOTE — Op Note (Signed)
[facility] Patient Name: Brittany Fuentes Procedure Date: 01/28/2023 8:59 AM MRN: [id number] Endoscopist: Docia Chuck. Henrene Pastor , MD, [id number] Age: 77 Referring MD:  Date of Birth: 05/13/1946 Gender: Female Account #: [id number] Procedure:                Colonoscopy with cold snare polypectomy x 4 Indications:              High risk colon cancer surveillance: Personal                            history of multiple (3 or more) adenomas. Mother                            with colon cancer greater than age 29. Previous                            examinations 2006, 2009, 2013, 2018 Medicines:                Monitored Anesthesia Care Procedure:                Pre-Anesthesia Assessment:                           - Prior to the procedure, a History and Physical                            was performed, and patient medications and                            allergies were reviewed. The patient's tolerance of                            previous anesthesia was also reviewed. The risks                            and benefits of the procedure and the sedation                            options and risks were discussed with the patient.                            All questions were answered, and informed consent                            was obtained. Prior Anticoagulants: The patient has                            taken no anticoagulant or antiplatelet agents. ASA                            Grade Assessment: II - A patient with mild systemic                            disease. After reviewing the risks and benefits,  the patient was deemed in satisfactory condition to                            undergo the procedure.                           After obtaining informed consent, the colonoscope                            was passed under direct vision. Throughout the                            procedure, the patient's blood pressure, pulse, and                             oxygen saturations were monitored continuously. The                            Olympus CF-HQ190L [id number]) Colonoscope was                            introduced through the anus and advanced to the the                            cecum, identified by appendiceal orifice and                            ileocecal valve. The ileocecal valve, appendiceal                            orifice, and rectum were photographed. The quality                            of the bowel preparation was excellent. The                            colonoscopy was performed without difficulty. The                            patient tolerated the procedure well. The bowel                            preparation used was SUPREP via split dose                            instruction. Scope In: 9:15:15 AM Scope Out: 9:52:53 AM Scope Withdrawal Time: 0 hours 19 minutes 19 seconds  Total Procedure Duration: 0 hours 37 minutes 38 seconds  Findings:                 Four polyps were found in the rectum, sigmoid                            colon, transverse colon and ascending colon. The  polyps were 3 to 12 mm in size. These polyps were                            removed with a cold snare. Resection and retrieval                            were complete.                           A few diverticula were found in the sigmoid colon.                           External and internal hemorrhoids were found during                            retroflexion.                           The exam was otherwise without abnormality on                            direct and retroflexion views. The colon was                            tortuous and redundant. Complications:            No immediate complications. Estimated blood loss:                            None. Estimated Blood Loss:     Estimated blood loss: none. Impression:               - Four 3 to 12 mm polyps in the rectum, in the                             sigmoid colon, in the transverse colon and in the                            ascending colon.                           - Diverticulosis in the sigmoid colon.                           - External and internal hemorrhoids.                           - The examination was otherwise normal on direct                            and retroflexion views.                           - REDUNDANT colon. Recommendation:           - Repeat colonoscopy in 3 years for surveillance.                           -  Patient has a contact number available for                            emergencies. The signs and symptoms of potential                            delayed complications were discussed with the                            patient. Return to normal activities tomorrow.                            Written discharge instructions were provided to the                            patient.                           - Resume previous diet.                           - Continue present medications.                           - Await pathology results. Docia Chuck. Henrene Pastor, MD 01/28/2023 10:02:08 AM This report has been signed electronically.

## 2023-01-28 NOTE — Progress Notes (Signed)
12/11/2022 Brittany Fuentes. Diluzio [id number] 05-20-1946   Referring provider: Derrill Fuentes., MD Primary GI doctor: Dr. Henrene Fuentes   ASSESSMENT AND PLAN:    Generalized abdominal pain with change in bowel habit and GERD, fever, chills Scheduled for Colonoscopy 02/07,patient would like to try to keep but will depend on labs and if patient will be able to tolerate the prep closer to the colonoscopy With acuity and chills, can be from infection, will check diatherix for GI pathogen panel with Cdiff and H pylori done here in the office and if positive we should have enough time to reschedule.  She is also tapering off klonopin and tapered off topiramate so some of the symptoms could be withdrawal, discussed with patient and will discuss with PCP Check CBC, CMET, TSH and sed rate Patient had surprising amount of soft stool with her rectal exam, will get KUB Can add on FDGard, pepcid, and continue levsin, given information about post infectious IBS Pending labs, can consider CT AB and pelvis.   Patient Care Team: Brittany Fuentes., MD as PCP - General (Family Medicine)   HISTORY OF PRESENT ILLNESS: 77 y.o. female with a past medical history of family history of colon cancer and a personal history of adenomatous colon polyps, chronic constipation, history of breast cancer s/p bilateral mastectomy, GERD on nexium and others listed below presents for evaluation of GI upset.   10/2017 Colonoscopy 2 diminutive adenomatous polyps which were removed and external hemorrhoids. Follow-up in 5 years recommended.  09/15/2018 office visit with Dr. 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 Linzess caused AB pain.    Scheduled for colonosocpy 02/07 but for 6 days has been having GI upset, feeling weak, only liquids, here for evaluation.    06/06/2022 CBC HGB 12, MCV 101 07/27/22 GFR 45, normal liver    She states since the 01/23 she has bee sick.  She had at least 5-6 Bm's, soft serve ice  cream that first day and following day, started to have AB cramping associated with it.  Start bowel rest with liquids like jello, fluids and banana. Had some left over levsin and took.  She has had chills with fever in the beginning.  She has been drinking peppermint tea but had severe gastritis yesterday, state had severe upper epigastric pain.  Denies nausea, vomiting.  She had a little bit of broth and noodles and she started again.  She had been on klonopin at night for 20 years at night, and then in April stopped klonopin but had severe withdraws, she is one month into a taper off klonopin, she is on 1.5 mg.  She had been on the topamax 100mg  since her mastectomy in 2012 for itching, she was transitioning down 25 mg every week for 3 weeks. She stopped it Wednesday the 01/24.  No supplements over the counter.  No ABX in last 6 month, no travel, no sick contacts.  Has had sinus issues, taking mucinex, feels fluid filled in ears.  Denies NSAIDS, ETOH.  Son is ortho PA in [place].    She  reports that she has never smoked. She has never used smokeless tobacco. She reports that she does not drink alcohol and does not use drugs.   Current Medications:    Current Outpatient Medications (Endocrine & Metabolic):    denosumab (PROLIA) 60 MG/ML SOSY injection, Inject into the skin.     Current Outpatient Medications (Cardiovascular):    amLODipine (NORVASC)  10 MG tablet, Take 10 mg by mouth daily.   pravastatin (PRAVACHOL) 20 MG tablet, Take 20 mg by mouth daily.     Current Outpatient Medications (Respiratory):    promethazine (PHENERGAN) 12.5 MG tablet, Take 1 tablet (12.5 mg total) by mouth every 6 (six) hours as needed for nausea or vomiting.             Current Outpatient Medications (Other):    clonazePAM (KLONOPIN) 1 MG tablet, Take 1 mg by mouth. Take 2 tabs at bedtime   esomeprazole (NEXIUM) 40 MG capsule, Take 40 mg by mouth daily at 12 noon.   hyoscyamine (ANASPAZ)  0.125 MG TBDP disintergrating tablet, Place 0.125 mg under the tongue as needed.   Probiotic Product (PROBIOTIC ADVANCED PO), Take by mouth.   Current Facility-Administered Medications (Other):    0.9 %  sodium chloride infusion   Medical History:      Past Medical History:  Diagnosis Date   Allergy      seasonal   Anxiety     Arthritis      lower back, hand joints    Breast cancer ([place])      03/2011-BILAT MASTEC--triple negative stage 1   Cataract      growing cataracts    Chronic kidney disease      kidney stones    GERD (gastroesophageal reflux disease)     Hyperlipidemia     Hypertension      pt denies    IBS (irritable bowel syndrome)     Insomnia     Nausea alone      chronic   Osteoporosis      Allergies:       Allergies  Allergen Reactions   Garlic Other (See Comments)      Extreme gastro reactions   Meperidine Hcl        PT DENIES ALLERGY TO THIS   Onion Other (See Comments)      Extreme gastro reactions   Oxycodone Hcl     Propoxyphene N-Acetaminophen Hives      Surgical History:  She  has a past surgical history that includes Mastectomy; Total abdominal hysterectomy; Cholecystectomy; Tonsillectomy; Colonoscopy; Polypectomy; and Cosmetic surgery. Family History:  Her family history includes Colon cancer in her mother; Crohn's disease in her sister; Diabetes in her maternal grandfather, maternal grandmother, and sister; Liver disease in her sister; Stomach cancer in her mother; Ulcerative colitis in her sister.   REVIEW OF SYSTEMS  : All other systems reviewed and negative except where noted in the History of Present Illness.   PHYSICAL EXAM: BP 124/78   Pulse (!) 57   Ht 5' 5.5" (1.664 m)   Wt 124 lb (56.2 kg)   BMI 20.32 kg/m  General:   Pleasant, well developed female in no acute distress Head:   Normocephalic and atraumatic. Eyes:  sclerae anicteric,conjunctive pink  Heart:   regular rate and rhythm Pulm:  Clear anteriorly; no  wheezing Abdomen:   Soft, Flat AB, Active bowel sounds. mild tenderness in the epigastrium and in the LLQ. Without guarding and Without rebound, No organomegaly appreciated. Rectal: hemorrhoidal skin tags, normal rectal tone, internal hemorrhoids appreciated, no masses, non tender, large volume soft brown stool Extremities:  Without edema. Msk: Symmetrical without gross deformities. Peripheral pulses intact.  Neurologic:  Alert and  oriented x4;  No focal deficits.  Skin:   Dry and intact without significant lesions or rashes. Psychiatric:  Cooperative. Normal mood and affect.  Vladimir Crofts, PA-C 2:37 PM   Recent history and physical as outlined above.  Laboratory and stool studies unremarkable.  Ultrasound unremarkable.  Now for colonoscopy.

## 2023-01-28 NOTE — Progress Notes (Signed)
Called to room to assist during endoscopic procedure.  Patient ID and intended procedure confirmed with present staff. Received instructions for my participation in the procedure from the performing physician.  

## 2023-01-29 ENCOUNTER — Telehealth: Payer: Self-pay | Admitting: *Deleted

## 2023-01-29 NOTE — Telephone Encounter (Signed)
No answer on  follow up call. Left message.   

## 2023-02-05 ENCOUNTER — Encounter: Payer: Self-pay | Admitting: Internal Medicine

## 2023-03-23 ENCOUNTER — Other Ambulatory Visit: Payer: Self-pay | Admitting: Physician Assistant

## 2023-04-03 ENCOUNTER — Encounter: Payer: Self-pay | Admitting: Internal Medicine

## 2023-04-03 ENCOUNTER — Ambulatory Visit (INDEPENDENT_AMBULATORY_CARE_PROVIDER_SITE_OTHER): Payer: Medicare Other | Admitting: Internal Medicine

## 2023-04-03 VITALS — BP 124/64 | HR 83 | Ht 65.5 in | Wt 126.0 lb

## 2023-04-03 DIAGNOSIS — K649 Unspecified hemorrhoids: Secondary | ICD-10-CM

## 2023-04-03 DIAGNOSIS — K589 Irritable bowel syndrome without diarrhea: Secondary | ICD-10-CM

## 2023-04-03 DIAGNOSIS — R109 Unspecified abdominal pain: Secondary | ICD-10-CM | POA: Diagnosis not present

## 2023-04-03 DIAGNOSIS — K5730 Diverticulosis of large intestine without perforation or abscess without bleeding: Secondary | ICD-10-CM

## 2023-04-03 MED ORDER — HYOSCYAMINE SULFATE 0.125 MG PO TBDP: 0.1250 mg | ORAL_TABLET | ORAL | 2 refills | Status: DC | PRN

## 2023-04-03 NOTE — Patient Instructions (Signed)
_______________________________________________________  If your blood pressure at your visit was 140/90 or greater, please contact your primary care physician to follow up on this.  _______________________________________________________  If you are age 76 or older, your body mass index should be between 23-30. Your Body mass index is 20.65 kg/m. If this is out of the aforementioned range listed, please consider follow up with your Primary Care Provider.  If you are age 58 or younger, your body mass index should be between 19-25. Your Body mass index is 20.65 kg/m. If this is out of the aformentioned range listed, please consider follow up with your Primary Care Provider.   ________________________________________________________  The [place] [facility] providers would like to encourage you to use [facility] to communicate with providers for non-urgent requests or questions.  Due to long hold times on the telephone, sending your provider a message by [facility] may be a faster and more efficient way to get a response.  Please allow 48 business hours for a response.  Please remember that this is for non-urgent requests.  _______________________________________________________  We have sent the following medications to your pharmacy for you to pick up at your convenience: Levsin  Please follow up as needed

## 2023-04-03 NOTE — Progress Notes (Signed)
HISTORY OF PRESENT ILLNESS:  Brittany Fuentes. Brittany Fuentes is a 77 y.o. female, with a family history of colon cancer personal history of adenomatous colon polyps, chronic constipation associated abdominal bloating discomfort, and hemorrhoids.  The patient initially made this appointment wondered about management of her hemorrhoids.  She last underwent complete colonoscopy January 25, 2023 was found to have multiple adenomatous and sessile serrated colon polyps, sigmoid diverticulosis, and both internal and external hemorrhoids.  Follow-up in 3 years recommended.  Patient notices her hemorrhoids when wiping.  No bleeding.  She tells me that she has been under stress recently after her husband took a bad fall.  She was under stress yesterday reported that she had a bowel movement in the morning followed by multiple soft bowel movements.  Her stomach has been upset and bloated.  She has been on pre-probiotic which she stated has helped her gut overall.  She does use Levsin sublingual as needed.  This helps.  Request a refill.  She also had questions regarding diverticulosis.  REVIEW OF SYSTEMS:  All non-GI ROS negative unless otherwise stated in the HPI except for sinus allergies, anxiety, arthritis, ankle edema  Past Medical History:  Diagnosis Date   Allergy    seasonal   Anxiety    Arthritis    lower back, hand joints    Breast cancer ([facility])    03/2011-BILAT MASTEC--triple negative stage 1   Cataract    growing cataracts    Chronic kidney disease    kidney stones    GERD (gastroesophageal reflux disease)    Hyperlipidemia    Hypertension    pt denies    IBS (irritable bowel syndrome)    Insomnia    Nausea alone    chronic   Osteoporosis     Past Surgical History:  Procedure Laterality Date   CHOLECYSTECTOMY     COLONOSCOPY     COSMETIC SURGERY     bilateral breast   MASTECTOMY     Bil /2012   POLYPECTOMY     TONSILLECTOMY     TOTAL ABDOMINAL HYSTERECTOMY      Social History Brittany  K. Fuentes  reports that she has never smoked. She has never used smokeless tobacco. She reports that she does not drink alcohol and does not use drugs.  family history includes Colon cancer in her mother; Crohn's disease in her sister; Diabetes in her maternal grandfather, maternal grandmother, and sister; Liver disease in her sister; Stomach cancer in her mother; Ulcerative colitis in her sister.  Allergies  Allergen Reactions   Garlic Other (See Comments)    Extreme gastro reactions  Extreme gastro reactions, Extreme gastro reactions   Carbamazepine Other (See Comments)    Hallucinations   Hallucinations   Meperidine Hcl     PT DENIES ALLERGY TO THIS   Onion Other (See Comments)    Extreme gastro reactions   Oxycodone Hcl    Propoxyphene N-Acetaminophen Hives       PHYSICAL EXAMINATION: Vital signs: BP 124/64   Pulse 83   Ht 5' 5.5" (1.664 m)   Wt 126 lb (57.2 kg)   BMI 20.65 kg/m   Constitutional: generally well-appearing, no acute distress Psychiatric: alert and oriented x3, cooperative Eyes: extraocular movements intact, anicteric, conjunctiva pink Mouth: oral pharynx moist, no lesions Neck: supple no lymphadenopathy Cardiovascular: heart regular rate and rhythm, no murmur Lungs: clear to auscultation bilaterally Abdomen: soft, nontender, nondistended, no obvious ascites, no peritoneal signs, normal bowel sounds, no organomegaly Rectal: Omitted.  Recent evaluated at colonoscopy Extremities: no clubbing, cyanosis, or lower extremity edema bilaterally Skin: no lesions on visible extremities Neuro: No focal deficits.  Cranial nerves intact  ASSESSMENT:  1.  History of constipation-predominant IBS 2.  Intermittent problems with bloating and abdominal cramping. 3.  History of adenomatous and sessile serrated colon polyps.  Last colonoscopy March 2024. 4.  Internal/external hemorrhoids 5.  Sigmoid diverticulosis.  Questions answered  PLAN:  1.  Discussion on  IBS 2.  Refill Levsin sublingual. 3.  We discussed management of internal and external hemorrhoids.  The patient wishes to hold off at this time given issues with her husband and lack of severity of symptoms related to hemorrhoids 4.  Surveillance colonoscopy 3 years 5.  Ongoing general medical care with PCP Total time of 30 minutes was spent preparing to see the patient, obtaining interval history, performing medically appropriate physical exam, counseling and educating the patient regarding above listed issues, ordering medication, and documenting clinical information in the health record

## 2023-04-09 MED ORDER — DICYCLOMINE HCL 20 MG PO TABS: 20.0000 mg | ORAL_TABLET | Freq: Three times a day (TID) | ORAL | 0 refills | Status: AC | PRN

## 2023-05-17 MED ORDER — LINACLOTIDE 72 MCG PO CAPS: 72.0000 ug | ORAL_CAPSULE | Freq: Every day | ORAL | 2 refills | Status: DC

## 2023-05-17 NOTE — Addendum Note (Signed)
Addended by: Missy Sabins on: 05/17/2023 10:23 AM   Modules accepted: Orders

## 2023-05-17 NOTE — Telephone Encounter (Signed)
PA team, is there anyway to see if patient is eligible for any assistance? I know she may not be with Medicare. Or if medication requires a PA? Thanks

## 2023-05-22 ENCOUNTER — Other Ambulatory Visit (HOSPITAL_COMMUNITY): Payer: Self-pay

## 2023-05-22 NOTE — Telephone Encounter (Signed)
Patient may have a deductible or coverage gap attributing to the cost of the medications. There is a patient assistance program for [employer] that I have faxed over to the office for provider and patient to complete. There are also savings cards available however if the patient is in the deductible or coverage gap phase of their plan this may not help much until she is out of that phase.

## 2023-10-22 ENCOUNTER — Other Ambulatory Visit: Payer: Self-pay | Admitting: Physician Assistant

## 2024-01-02 ENCOUNTER — Other Ambulatory Visit: Payer: Self-pay

## 2024-01-02 ENCOUNTER — Telehealth: Payer: Self-pay | Admitting: Physician Assistant

## 2024-01-02 DIAGNOSIS — K589 Irritable bowel syndrome without diarrhea: Secondary | ICD-10-CM

## 2024-01-02 MED ORDER — LINACLOTIDE 145 MCG PO CAPS: 145.0000 ug | ORAL_CAPSULE | Freq: Every day | ORAL | 3 refills | Status: DC

## 2024-01-02 MED ORDER — LINACLOTIDE 145 MCG PO CAPS: 145.0000 ug | ORAL_CAPSULE | Freq: Every day | ORAL | 3 refills | Status: AC

## 2024-01-02 NOTE — Telephone Encounter (Signed)
Script sent to pharmacy as requested

## 2024-01-02 NOTE — Telephone Encounter (Signed)
 Pt requesting prescripiton for linzess 145 be sent to her new pharmacy. She is on and would like the 145 dose prescribed. Please advise.

## 2024-01-02 NOTE — Telephone Encounter (Signed)
 Patient called and stated that she believes the [facility] is not working for her and is wanting up her dosage. Patient also stated that she changed pharmacy to [facility] in [place]. And a good call number for them is [phone]. Patient is requesting a call back. Please advise.

## 2024-01-02 NOTE — Telephone Encounter (Signed)
 Inbound call from patient stating pharmacy has medication on back order. Requesting to have prescription sent to [facility] on [address]. Please advise, thank you.
